# Patient Record
Sex: Male | Born: 1950 | Race: Black or African American | Hispanic: No | Marital: Married | Smoking: Never smoker
Health system: Southern US, Community
[De-identification: ages and names within clinical notes are randomized; demographics above are authoritative.]

## PROBLEM LIST (undated history)

## (undated) DIAGNOSIS — G8929 Other chronic pain: Secondary | ICD-10-CM

## (undated) DIAGNOSIS — I10 Essential (primary) hypertension: Secondary | ICD-10-CM

## (undated) DIAGNOSIS — M549 Dorsalgia, unspecified: Secondary | ICD-10-CM

## (undated) HISTORY — PX: SPINE SURGERY: SHX786

---

## 2002-10-28 ENCOUNTER — Ambulatory Visit (HOSPITAL_COMMUNITY): Admission: RE | Admit: 2002-10-28 | Discharge: 2002-10-28 | Payer: Self-pay | Admitting: Internal Medicine

## 2012-03-10 ENCOUNTER — Emergency Department (HOSPITAL_COMMUNITY): Payer: No Typology Code available for payment source

## 2012-03-10 ENCOUNTER — Emergency Department (HOSPITAL_COMMUNITY)
Admission: EM | Admit: 2012-03-10 | Discharge: 2012-03-11 | Disposition: A | Discharge status: Home / Self Care | Payer: No Typology Code available for payment source | Attending: Emergency Medicine | Admitting: Emergency Medicine

## 2012-03-10 ENCOUNTER — Encounter (HOSPITAL_COMMUNITY): Payer: Self-pay | Admitting: Emergency Medicine

## 2012-03-10 DIAGNOSIS — M549 Dorsalgia, unspecified: Secondary | ICD-10-CM | POA: Insufficient documentation

## 2012-03-10 DIAGNOSIS — S161XXA Strain of muscle, fascia and tendon at neck level, initial encounter: Secondary | ICD-10-CM

## 2012-03-10 DIAGNOSIS — IMO0002 Reserved for concepts with insufficient information to code with codable children: Secondary | ICD-10-CM | POA: Insufficient documentation

## 2012-03-10 DIAGNOSIS — S335XXA Sprain of ligaments of lumbar spine, initial encounter: Secondary | ICD-10-CM | POA: Insufficient documentation

## 2012-03-10 DIAGNOSIS — M542 Cervicalgia: Secondary | ICD-10-CM | POA: Insufficient documentation

## 2012-03-10 DIAGNOSIS — M25519 Pain in unspecified shoulder: Secondary | ICD-10-CM | POA: Insufficient documentation

## 2012-03-10 DIAGNOSIS — S46911A Strain of unspecified muscle, fascia and tendon at shoulder and upper arm level, right arm, initial encounter: Secondary | ICD-10-CM

## 2012-03-10 DIAGNOSIS — S139XXA Sprain of joints and ligaments of unspecified parts of neck, initial encounter: Secondary | ICD-10-CM | POA: Insufficient documentation

## 2012-03-10 DIAGNOSIS — S39012A Strain of muscle, fascia and tendon of lower back, initial encounter: Secondary | ICD-10-CM

## 2012-03-10 DIAGNOSIS — Y92410 Unspecified street and highway as the place of occurrence of the external cause: Secondary | ICD-10-CM | POA: Insufficient documentation

## 2012-03-10 HISTORY — DX: Essential (primary) hypertension: I10

## 2012-03-10 MED ORDER — ACETAMINOPHEN 325 MG PO TABS
650.0000 mg | ORAL_TABLET | Freq: Once | ORAL | Status: AC
  Administered 2012-03-10: 650 mg via ORAL
  Filled 2012-03-10: qty 2

## 2012-03-10 NOTE — ED Notes (Signed)
Patient transported to X-ray 
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
## 2012-03-11 MED ORDER — OXYCODONE-ACETAMINOPHEN 5-325 MG PO TABS: 1.0000 | ORAL_TABLET | ORAL | Status: AC | PRN

## 2012-03-11 MED ORDER — DIAZEPAM 5 MG PO TABS: 5.0000 mg | ORAL_TABLET | Freq: Two times a day (BID) | ORAL | Status: AC

## 2012-03-11 NOTE — ED Provider Notes (Signed)
Medical screening examination/treatment/procedure(s) were performed by non-physician practitioner and as supervising physician I was immediately available for consultation/collaboration.    Vida Roller, MD 03/11/12 1007

## 2012-04-15 ENCOUNTER — Other Ambulatory Visit (HOSPITAL_COMMUNITY): Payer: Self-pay | Admitting: Neurosurgery

## 2012-04-15 DIAGNOSIS — M545 Low back pain, unspecified: Secondary | ICD-10-CM

## 2012-04-30 ENCOUNTER — Ambulatory Visit (HOSPITAL_COMMUNITY)
Admission: RE | Admit: 2012-04-30 | Discharge: 2012-04-30 | Disposition: A | Discharge status: Home / Self Care | Payer: No Typology Code available for payment source | Source: Ambulatory Visit | Attending: Neurosurgery | Admitting: Neurosurgery

## 2012-04-30 DIAGNOSIS — M79609 Pain in unspecified limb: Secondary | ICD-10-CM | POA: Insufficient documentation

## 2012-04-30 DIAGNOSIS — M48061 Spinal stenosis, lumbar region without neurogenic claudication: Secondary | ICD-10-CM | POA: Insufficient documentation

## 2012-04-30 DIAGNOSIS — M545 Low back pain, unspecified: Secondary | ICD-10-CM | POA: Insufficient documentation

## 2012-04-30 DIAGNOSIS — K7689 Other specified diseases of liver: Secondary | ICD-10-CM | POA: Insufficient documentation

## 2012-05-11 ENCOUNTER — Other Ambulatory Visit (HOSPITAL_COMMUNITY): Payer: Self-pay | Admitting: Neurosurgery

## 2013-02-17 ENCOUNTER — Ambulatory Visit (INDEPENDENT_AMBULATORY_CARE_PROVIDER_SITE_OTHER): Payer: Medicare Other | Admitting: Family Medicine

## 2013-02-17 VITALS — BP 139/81 | HR 80 | Temp 98.3°F | Resp 16 | Ht 70.25 in | Wt 179.4 lb

## 2013-02-17 DIAGNOSIS — M545 Low back pain, unspecified: Secondary | ICD-10-CM

## 2013-02-17 DIAGNOSIS — IMO0002 Reserved for concepts with insufficient information to code with codable children: Secondary | ICD-10-CM

## 2013-02-17 DIAGNOSIS — M5410 Radiculopathy, site unspecified: Secondary | ICD-10-CM

## 2013-02-17 MED ORDER — OXYCODONE HCL 10 MG PO TABS: 10.0000 mg | ORAL_TABLET | Freq: Three times a day (TID) | ORAL | Status: DC

## 2013-02-17 NOTE — Progress Notes (Signed)
[facility]:  Office Visit  Chief Complaint:  Chief Complaint  Patient presents with  . Medication Refill  . Back Pain    HPI: Kurt Robinson is a 62 y.o. male who complains of low chronic back pain  Dr. Shane Crutch was his prior doctor for last 3 years but now retired and is no linger prescribing him narcotic meds. He works at [employer] He states that his Last pain meds has been over 30 days He was getting pain meds from Dr. Roseanne Reno from [facility] as well after Shane Crutch retired, he is moving to [place] and patient can't go there He is a retired Naval architect. He hurt his back on the job.  He hurt his back trying to raise th tractor trailer and ended up moving his back and hurting it instead  in 2011.  Has been to [facility], all they do is give him injections which did not help  Was getting only cortisone injections and was not working He was evaluated by Dr. Lelon Perla who is with [facility]  Who then sent  him to Dr. Shane Crutch He is having similar 10?10 sharp pain, denies incontinence, constipation; denies any new weakness,numbness or tingling. He does not think that he is dependent on it, it is just that the ocy 10 is the only thing that works, he sotmeimes has to take more of it dpending on what he activities he does.     04/2012 MRI of L spine shows:  1. 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  2. 2.5 cm right hepatic lobe lesion requires further evaluation.  Recommend follow-up dedicated MR liver without and with contrast.  He states he has been checked by [facility] and  his liver is ok.     Past Medical History  Diagnosis Date  . Hypertension    Past Surgical History  Procedure Laterality Date  . Spine surgery     History   Social History  . Marital Status: Married    Spouse Name: N/A    Number of Children: N/A  . Years of Education: N/A   Social History Main Topics  . Smoking status:  Never Smoker   . Smokeless tobacco: None  . Alcohol Use: No  . Drug Use: No  . Sexually Active: None   Other Topics Concern  . None   Social History Narrative  . None   Family History  Problem Relation Age of Onset  . Diabetes Other   . Stroke Other   . Diabetes Mother    Allergies  Allergen Reactions  . Celecoxib Nausea Only   Prior to Admission medications   Medication Sig Start Date End Date Taking? Authorizing Provider  Oxycodone HCl 10 MG TABS Take 10 mg by mouth 3 (three) times daily.   Yes Historical Provider, MD     ROS: The patient denies fevers, chills, night sweats, unintentional weight loss, chest pain, palpitations, wheezing, dyspnea on exertion, nausea, vomiting, abdominal pain, dysuria, hematuria, melena, numbness, weakness, or tingling.   All other systems have been reviewed and were otherwise negative with the exception of those mentioned in the HPI and as above.    PHYSICAL EXAM: Filed Vitals:   02/17/13 1643  BP: 139/81  Pulse: 80  Temp: 98.3 F (36.8 C)  Resp: 16   Filed Vitals:   02/17/13 1643  Height: 5' 10.25" (1.784 m)  Weight: 179 lb 6.4 oz (81.375 kg)  Body mass index is 25.57 kg/(m^2).  General: Alert, no acute distress HEENT:  Normocephalic, atraumatic, oropharynx patent.  Cardiovascular:  Regular rate and rhythm, no rubs murmurs or gallops.  No Carotid bruits, radial pulse intact. No pedal edema.  Respiratory: Clear to auscultation bilaterally.  No wheezes, rales, or rhonchi.  No cyanosis, no use of accessory musculature GI: No organomegaly, abdomen is soft and non-tender, positive bowel sounds.  No masses. No heaptomegaly Skin: No rashes. Neurologic: Facial musculature symmetric. Psychiatric: Patient is appropriate throughout our interaction. Lymphatic: No cervical lymphadenopathy Musculoskeletal: Gait intact. Tender-left side l-spine, para msk Decrease ROM in extension and flexion due to pain No saddle anesthesia 5/5  strength UE and Tyleigh Mahn 2/2 DTRs knee, ankle bil  LABS: No results found for this or any previous visit.   EKG/XRAY:   Primary read interpreted by Dr. Conley Rolls at [facility].   ASSESSMENT/PLAN: Encounter Diagnosis  Name Primary?  . 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 I can't verify all the pieces but he admits to me after a lengthy discussion that he is in so much pain he was not taking the meds as prescribed, He is supposed to take oxycodone 10 mg TID and had his last rx refilled on 5/16 at the [facility]. He has been out of it for about 13 -14 days.  He states sometime he takes more since he is in so much pain.  I will call Hassan's office and also [facility] management to see if he has ben discharged and update his chart if that is the case.  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  Rx 2 weeks supply of oxycodone 10 mg TID #42 pills. No more refills from our office for chronic pain. WE DISCUSSED HIS LIVER LESION ON PRIOR MRI, HE STATES THAT [facility] AND EVERYTHING WAS NORMAL. I will only rx oxycodone and not oxy-acetaminophen for this reason, he may be an unreliable source Return to [facility] Will refer to neurosurgeon or spine specialist, He was seen by Jerilynn Birkenhead Narcotic profile pulled, he is taking too much meds Gross sideeffects, risk and benefits, and alternatives of medications d/w patient. Patient is aware that all medications have potential sideeffects and we are unable to predict every sideeffect or drug-drug interaction that may occur. He denies incontinence, constipation; denies any new weakness,numbness or tingling.  F/u prn     Lameeka Schleifer PHUONG, DO 02/17/2013 5:54 PM

## 2014-05-08 ENCOUNTER — Encounter (HOSPITAL_COMMUNITY): Payer: Self-pay | Admitting: Emergency Medicine

## 2014-05-08 ENCOUNTER — Emergency Department (HOSPITAL_COMMUNITY): Payer: Medicare Other

## 2014-05-08 ENCOUNTER — Emergency Department (HOSPITAL_COMMUNITY)
Admission: EM | Admit: 2014-05-08 | Discharge: 2014-05-08 | Disposition: A | Discharge status: Home / Self Care | Payer: Medicare Other | Attending: Emergency Medicine | Admitting: Emergency Medicine

## 2014-05-08 DIAGNOSIS — J069 Acute upper respiratory infection, unspecified: Secondary | ICD-10-CM | POA: Insufficient documentation

## 2014-05-08 DIAGNOSIS — J029 Acute pharyngitis, unspecified: Secondary | ICD-10-CM | POA: Insufficient documentation

## 2014-05-08 DIAGNOSIS — IMO0002 Reserved for concepts with insufficient information to code with codable children: Secondary | ICD-10-CM | POA: Insufficient documentation

## 2014-05-08 DIAGNOSIS — I10 Essential (primary) hypertension: Secondary | ICD-10-CM | POA: Insufficient documentation

## 2014-05-08 DIAGNOSIS — G8929 Other chronic pain: Secondary | ICD-10-CM | POA: Insufficient documentation

## 2014-05-08 HISTORY — DX: Other chronic pain: G89.29

## 2014-05-08 HISTORY — DX: Dorsalgia, unspecified: M54.9

## 2014-05-08 MED ORDER — PREDNISONE 20 MG PO TABS
60.0000 mg | ORAL_TABLET | Freq: Once | ORAL | Status: AC
  Administered 2014-05-08: 60 mg via ORAL
  Filled 2014-05-08: qty 3

## 2014-05-08 MED ORDER — BENZONATATE 100 MG PO CAPS: 100.0000 mg | ORAL_CAPSULE | Freq: Three times a day (TID) | ORAL | Status: DC | PRN

## 2014-05-08 MED ORDER — SALINE SPRAY 0.65 % NA SOLN: 1.0000 | NASAL | Status: DC | PRN

## 2014-05-08 MED ORDER — PREDNISONE 20 MG PO TABS: 60.0000 mg | ORAL_TABLET | Freq: Every day | ORAL | Status: DC

## 2014-05-08 MED ORDER — BENZONATATE 100 MG PO CAPS
100.0000 mg | ORAL_CAPSULE | Freq: Once | ORAL | Status: AC
  Administered 2014-05-08: 100 mg via ORAL
  Filled 2014-05-08: qty 1

## 2014-05-08 NOTE — Discharge Instructions (Signed)
Upper Respiratory Infection, Adult An upper respiratory infection (URI) is also sometimes known as the common cold. The upper respiratory tract includes the nose, sinuses, throat, trachea, and bronchi. Bronchi are the airways leading to the lungs. Most people improve within 1 week, but symptoms can last up to 2 weeks. A residual cough may last even longer.  CAUSES Many different viruses can infect the tissues lining the upper respiratory tract. The tissues become irritated and inflamed and often become very moist. Mucus production is also common. A cold is contagious. You can easily spread the virus to others by oral contact. This includes kissing, sharing a glass, coughing, or sneezing. Touching your mouth or nose and then touching a surface, which is then touched by another person, can also spread the virus. SYMPTOMS  Symptoms typically develop 1 to 3 days after you come in contact with a cold virus. Symptoms vary from person to person. They may include:  Runny nose.  Sneezing.  Nasal congestion.  Sinus irritation.  Sore throat.  Loss of voice (laryngitis).  Cough.  Fatigue.  Muscle aches.  Loss of appetite.  Headache.  Low-grade fever. DIAGNOSIS  You might diagnose your own cold based on familiar symptoms, since most people get a cold 2 to 3 times a year. Your caregiver can confirm this based on your exam. Most importantly, your caregiver can check that your symptoms are not due to another disease such as strep throat, sinusitis, pneumonia, asthma, or epiglottitis. Blood tests, throat tests, and X-rays are not necessary to diagnose a common cold, but they may sometimes be helpful in excluding other more serious diseases. Your caregiver will decide if any further tests are required. RISKS AND COMPLICATIONS  You may be at risk for a more severe case of the common cold if you smoke cigarettes, have chronic heart disease (such as heart failure) or lung disease (such as asthma), or if  you have a weakened immune system. The very young and very old are also at risk for more serious infections. Bacterial sinusitis, middle ear infections, and bacterial pneumonia can complicate the common cold. The common cold can worsen asthma and chronic obstructive pulmonary disease (COPD). Sometimes, these complications can require emergency medical care and may be life-threatening. PREVENTION  The best way to protect against getting a cold is to practice good hygiene. Avoid oral or hand contact with people with cold symptoms. Wash your hands often if contact occurs. There is no clear evidence that vitamin C, vitamin E, echinacea, or exercise reduces the chance of developing a cold. However, it is always recommended to get plenty of rest and practice good nutrition. TREATMENT  Treatment is directed at relieving symptoms. There is no cure. Antibiotics are not effective, because the infection is caused by a virus, not by bacteria. Treatment may include:  Increased fluid intake. Sports drinks offer valuable electrolytes, sugars, and fluids.  Breathing heated mist or steam (vaporizer or shower).  Eating chicken soup or other clear broths, and maintaining good nutrition.  Getting plenty of rest.  Using gargles or lozenges for comfort.  Controlling fevers with ibuprofen or acetaminophen as directed by your caregiver.  Increasing usage of your inhaler if you have asthma. Zinc gel and zinc lozenges, taken in the first 24 hours of the common cold, can shorten the duration and lessen the severity of symptoms. Pain medicines may help with fever, muscle aches, and throat pain. A variety of non-prescription medicines are available to treat congestion and runny nose. Your caregiver   can make recommendations and may suggest nasal or lung inhalers for other symptoms.  HOME CARE INSTRUCTIONS   Only take over-the-counter or prescription medicines for pain, discomfort, or fever as directed by your  caregiver.  Use a warm mist humidifier or inhale steam from a shower to increase air moisture. This may keep secretions moist and make it easier to breathe.  Drink enough water and fluids to keep your urine clear or pale yellow.  Rest as needed.  Return to work when your temperature has returned to normal or as your caregiver advises. You may need to stay home longer to avoid infecting others. You can also use a face mask and careful hand washing to prevent spread of the virus. SEEK MEDICAL CARE IF:   After the first few days, you feel you are getting worse rather than better.  You need your caregiver's advice about medicines to control symptoms.  You develop chills, worsening shortness of breath, or brown or red sputum. These may be signs of pneumonia.  You develop yellow or brown nasal discharge or pain in the face, especially when you bend forward. These may be signs of sinusitis.  You develop a fever, swollen neck glands, pain with swallowing, or white areas in the back of your throat. These may be signs of strep throat. SEEK IMMEDIATE MEDICAL CARE IF:   You have a fever.  You develop severe or persistent headache, ear pain, sinus pain, or chest pain.  You develop wheezing, a prolonged cough, cough up blood, or have a change in your usual mucus (if you have chronic lung disease).  You develop sore muscles or a stiff neck. Document Released: 02/18/2001 Document Revised: 11/17/2011 Document Reviewed: 11/30/2013 Exit[employer] Patient Information 2015 [employer]. This information is not intended to replace advice given to you by your health care provider. Make sure you discuss any questions you have with your health care provider.  

## 2014-05-08 NOTE — ED Provider Notes (Signed)
CSN: [id number]     Arrival date & time 05/08/14  0110 History   First MD Initiated Contact with Patient 05/08/14 0117     Chief Complaint  Patient presents with  . Generalized Body Aches     (Consider location/radiation/quality/duration/timing/severity/associated sxs/prior Treatment) HPI  This is a 63 year old male with a history of hypertension and chronic back pain who presents with generalized bodyaches, sore throat, cough. Onset of symptoms were Monday. Patient reports a cough productive of yellow phlegm. He endorses chills without objective fevers. He states that he gets chest pain with coughing. He is a nonsmoker. He has tried air the counter cough syrup which is not helped. Patient also reports sore throat. He denies any difficulty swallowing or breathing. He was seen by his primary care physician on Friday and given cough syrup which is "not helping." Patient states that he was told that he had a cold. He states when he normally gets these symptoms, amoxicillin "takes care of it." Patient denies any nausea, vomiting, diarrhea, abdominal pain. No known sick contacts.  Past Medical History  Diagnosis Date  . Hypertension   . Chronic back pain    Past Surgical History  Procedure Laterality Date  . Spine surgery     Family History  Problem Relation Age of Onset  . Diabetes Other   . Stroke Other   . Diabetes Mother    History  Substance Use Topics  . Smoking status: Never Smoker   . Smokeless tobacco: Not on file  . Alcohol Use: No    Review of Systems  Constitutional: Positive for chills. Negative for fever.  HENT: Positive for rhinorrhea and sore throat. Negative for ear pain and sinus pressure.   Respiratory: Positive for cough and shortness of breath. Negative for chest tightness.   Cardiovascular: Negative.  Negative for chest pain.  Gastrointestinal: Negative.  Negative for nausea, vomiting, abdominal pain and diarrhea.  Genitourinary: Negative.  Negative for  dysuria.  Skin: Negative for rash.  Neurological: Negative for headaches.  All other systems reviewed and are negative.     Allergies  Celecoxib  Home Medications   Prior to Admission medications   Medication Sig Start Date End Date Taking? Authorizing Provider  fluticasone (FLONASE) 50 MCG/ACT nasal spray Place 1 spray into both nostrils daily.   Yes Historical Provider, MD  benzonatate (TESSALON) 100 MG capsule Take 1 capsule (100 mg total) by mouth 3 (three) times daily as needed for cough. 05/08/14   Shon Baton, MD  predniSONE (DELTASONE) 20 MG tablet Take 3 tablets (60 mg total) by mouth daily with breakfast. 05/08/14   Shon Baton, MD  sodium chloride (OCEAN) 0.65 % SOLN nasal spray Place 1 spray into both nostrils as needed for congestion. 05/08/14   Shon Baton, MD   BP 137/95  Pulse 78  Temp(Src) 99.1 F (37.3 C) (Oral)  Resp 14  Ht  (1.753 m)  Wt 174 lb (78.926 kg)  BMI 25.68 kg/m2  SpO2 100% Physical Exam  Nursing note and vitals reviewed. Constitutional: He is oriented to person, place, and time. He appears well-developed and well-nourished.  HENT:  Head: Normocephalic and atraumatic.  Mouth/Throat: Oropharynx is clear and moist. No oropharyngeal exudate.  Posterior oropharynx without significant erythema, postnasal drip noted  Eyes: Pupils are equal, round, and reactive to light.  Neck: Neck supple.  Cardiovascular: Normal rate, regular rhythm and normal heart sounds.   No murmur heard. Pulmonary/Chest: Effort normal and breath sounds normal.  No respiratory distress. He has no wheezes. He exhibits tenderness.  Abdominal: Soft. Bowel sounds are normal. There is no tenderness. There is no rebound.  Musculoskeletal: He exhibits no edema.  Lymphadenopathy:    He has no cervical adenopathy.  Neurological: He is alert and oriented to person, place, and time.  Skin: Skin is warm and dry. No rash noted.  Psychiatric: He has a normal mood and  affect.    ED Course  Procedures (including critical care time) Labs Review Labs Reviewed - No data to display  Imaging Review Dg Chest 2 View  05/08/2014   CLINICAL DATA:  GENERALIZED BODY ACHES  EXAM: CHEST - 2 VIEW  COMPARISON:  03/10/2012  FINDINGS: Lungs are clear. Heart size and mediastinal contours are within normal limits. No effusion. Visualized skeletal structures are unremarkable.  IMPRESSION: No acute cardiopulmonary disease.   Electronically Signed   By: Oley Balm M.D.   On: 05/08/2014 01:59     EKG Interpretation None      MDM   Final diagnoses:  Upper respiratory infection    Patient presents with URI symptoms including chills, sore throat, cough, shortness of breath. He is nontoxic on exam. He is afebrile. His pulmonary exam is reassuring. He does have tenderness to palpation that is reproducible on exam. Suspect patient's chest pain with coughing it is likely musculoskeletal. Will obtain a chest x-ray to rule out pneumonia. Patient was given Tessalon Perles and prednisone. Chest x-ray is negative for pneumonia. Discuss with patient optimizing supportive care including nasal saline, burst of steroids, andTessalon Perles. Symptoms are most consistent with a viral process. At this time I do not see any indication for antibiotics.  Patient will followup with his primary physician symptoms worsen. He was advised that cough may persist up to 2 weeks from onset of symptoms. Patient stated understanding and was in agreement with plan.  After history, exam, and medical workup I feel the patient has been appropriately medically screened and is safe for discharge home. Pertinent diagnoses were discussed with the patient. Patient was given return precautions.     Shon Baton, MD 05/08/14 [phone]

## 2014-05-08 NOTE — ED Notes (Signed)
Pt arrives c/o of generalized body aches, malaise, sore throat, cough, dizziness since Tuesday. Coughing up phlegm.

## 2015-06-29 IMAGING — CR DG CHEST 2V
2 series · 2 of 2 positions shown · non-contrast
Comparison: 03/10/2012

CLINICAL DATA: GENERALIZED BODY ACHES

EXAM:
CHEST - 2 VIEW

[w chest pa]
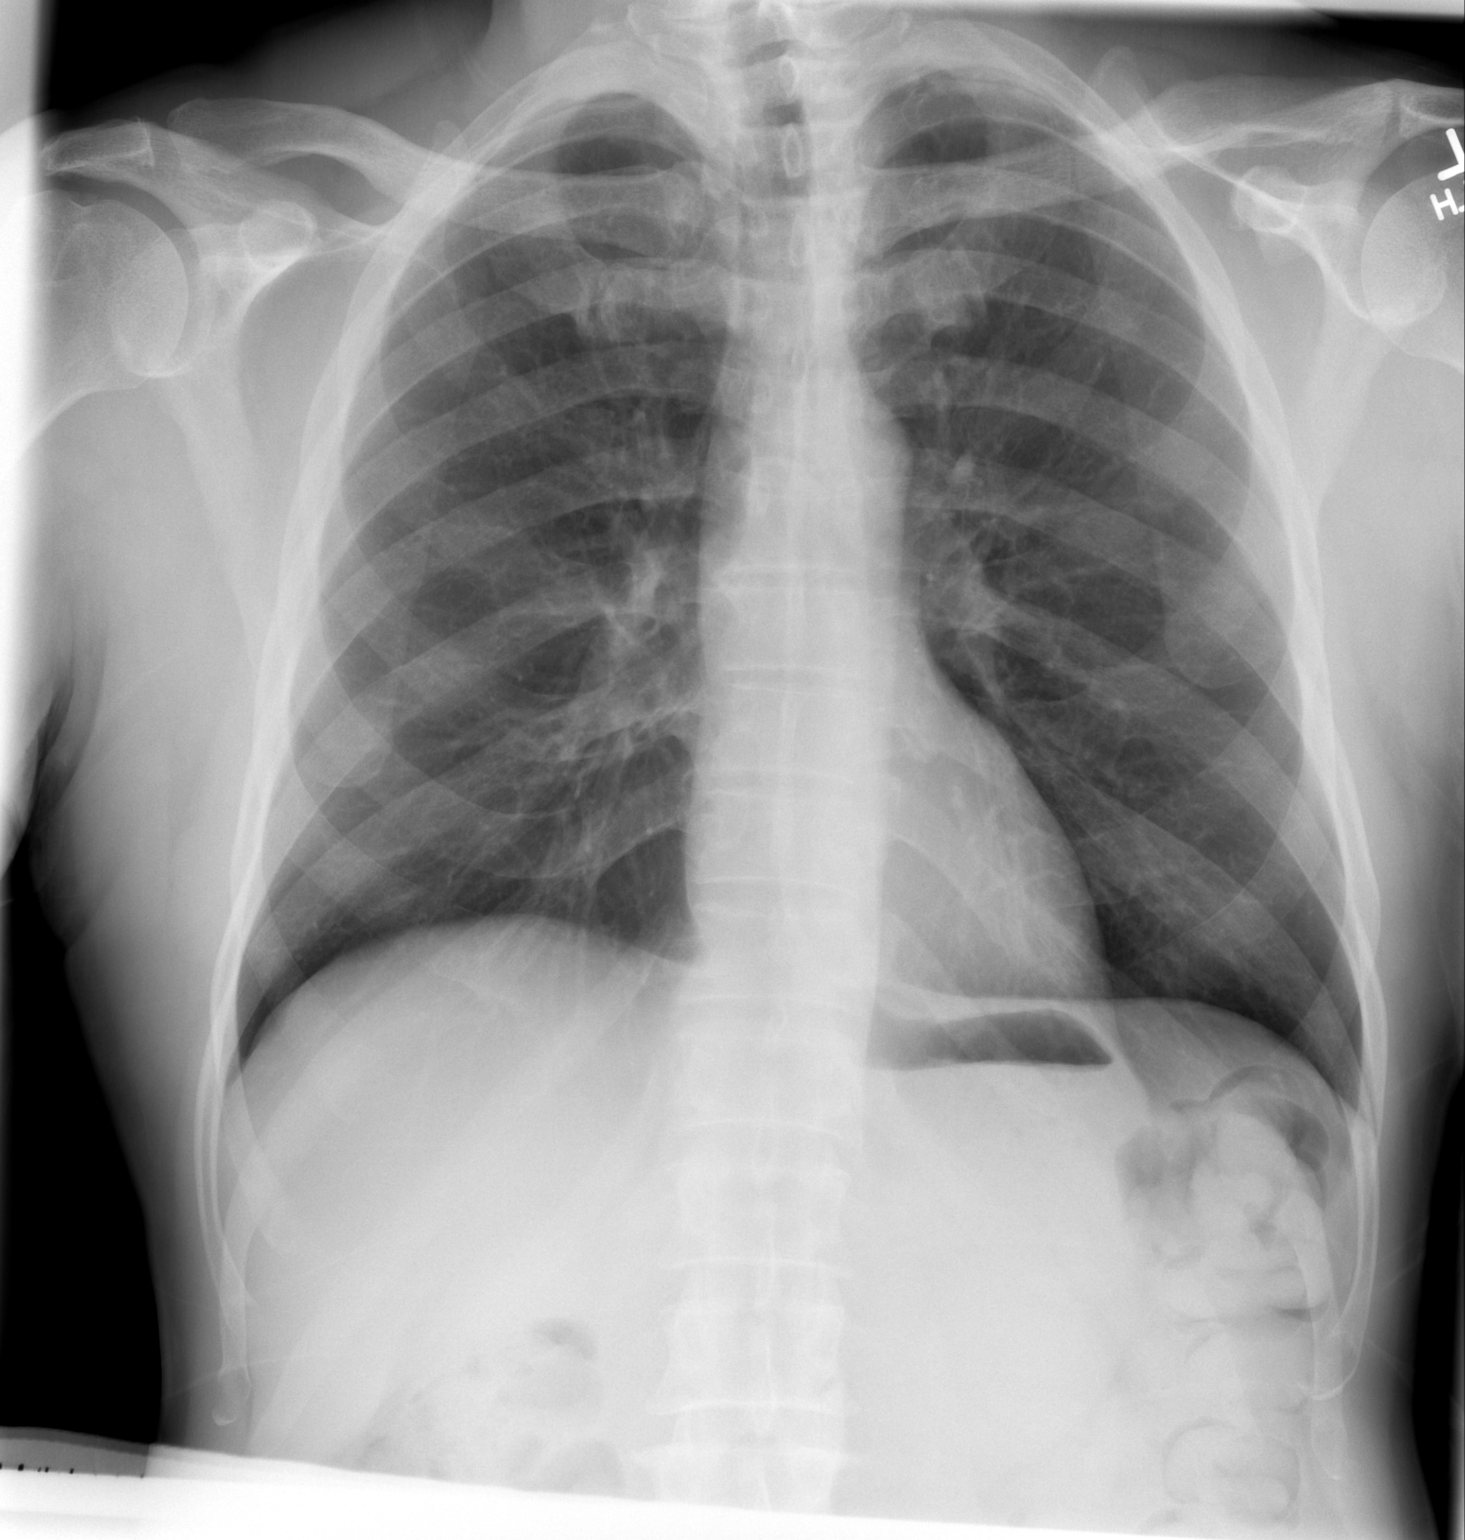

[w chest lat]
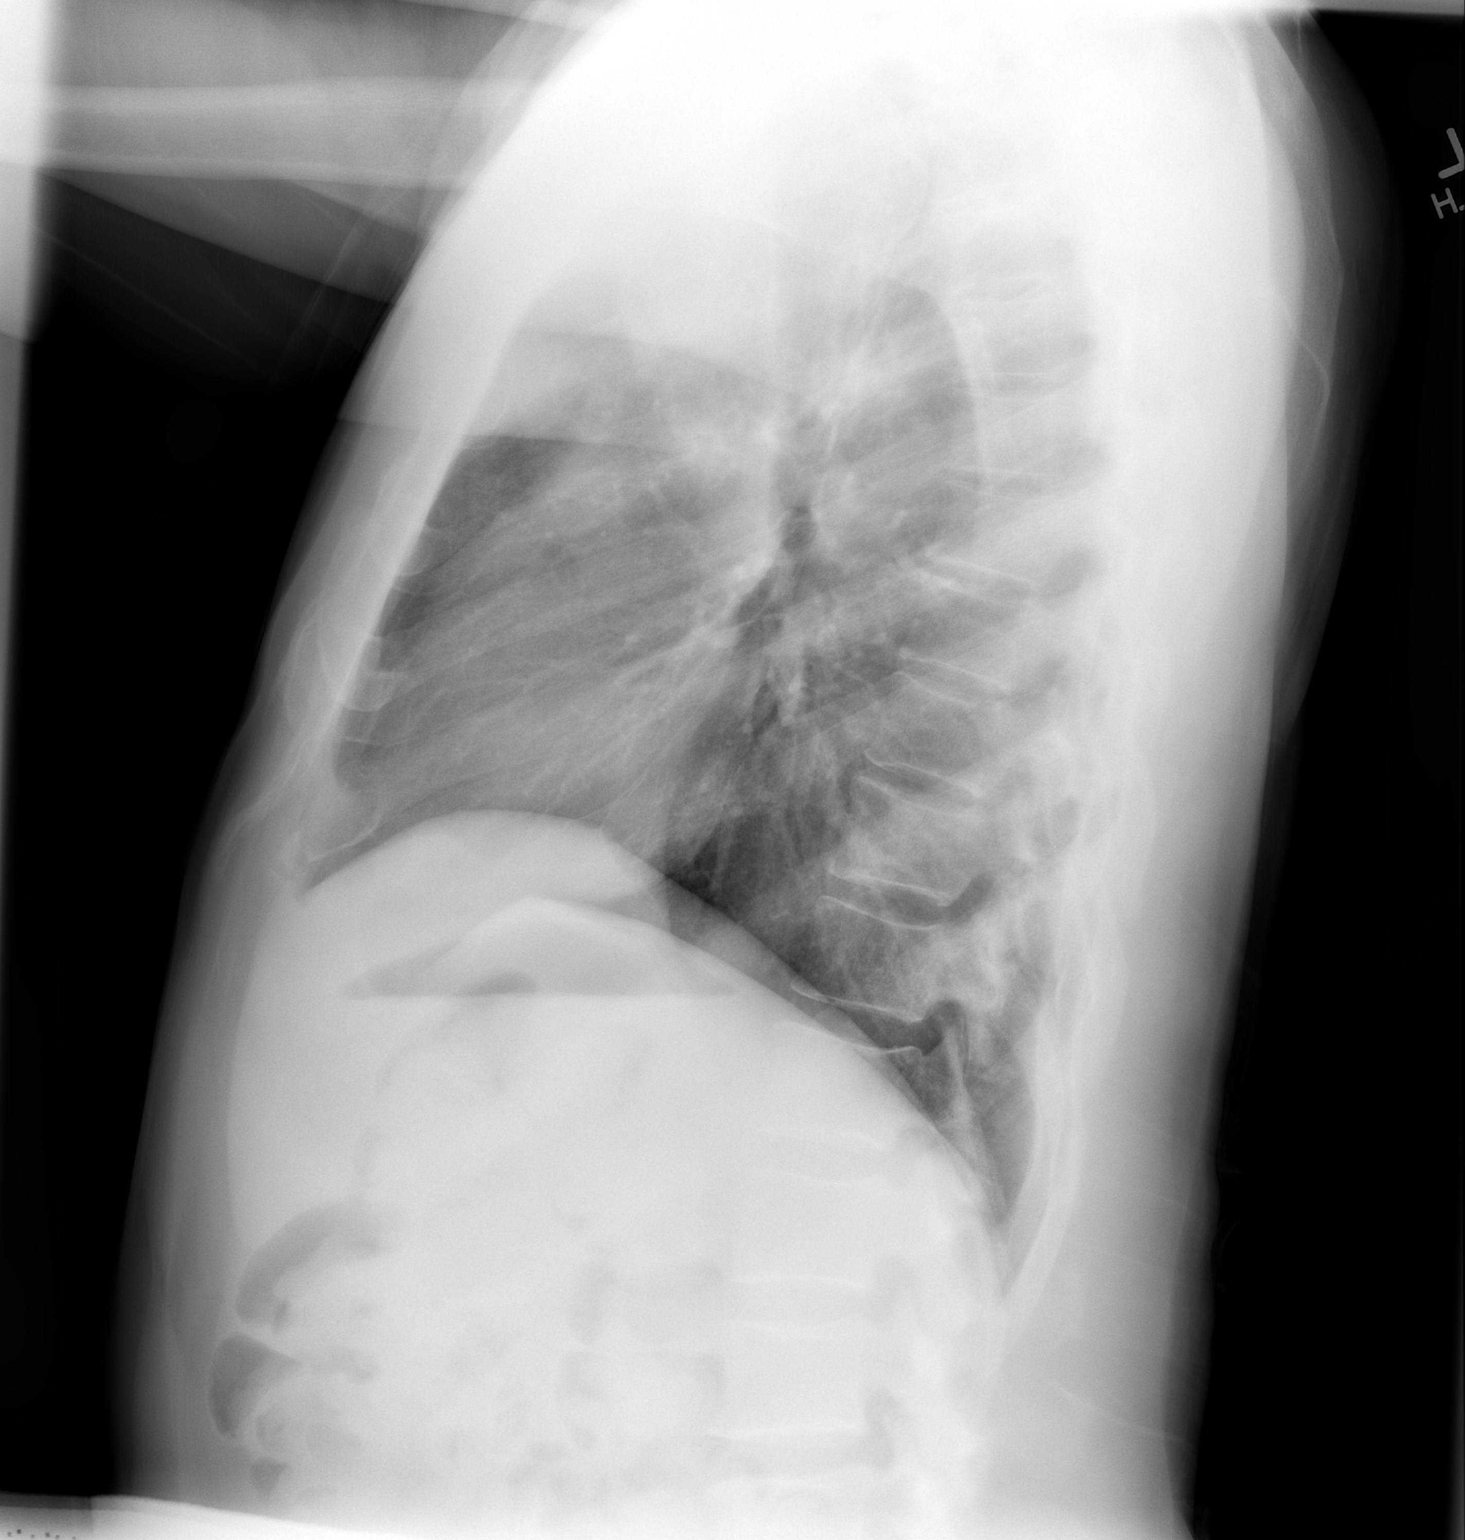

[2 of 2 positions shown; findings below may reference images not displayed]

FINDINGS: Lungs are clear. Heart size and mediastinal contours are within
normal limits.
No effusion.
Visualized skeletal structures are unremarkable.
IMPRESSION: No acute cardiopulmonary disease.

## 2019-04-17 ENCOUNTER — Emergency Department (HOSPITAL_COMMUNITY): Payer: Medicare Other

## 2019-04-17 ENCOUNTER — Other Ambulatory Visit: Payer: Self-pay

## 2019-04-17 ENCOUNTER — Encounter (HOSPITAL_COMMUNITY): Payer: Self-pay | Admitting: *Deleted

## 2019-04-17 ENCOUNTER — Emergency Department (HOSPITAL_COMMUNITY)
Admission: EM | Admit: 2019-04-17 | Discharge: 2019-04-17 | Disposition: A | Discharge status: Home / Self Care | Payer: Medicare Other | Attending: Emergency Medicine | Admitting: Emergency Medicine

## 2019-04-17 DIAGNOSIS — Z79899 Other long term (current) drug therapy: Secondary | ICD-10-CM | POA: Diagnosis not present

## 2019-04-17 DIAGNOSIS — R55 Syncope and collapse: Secondary | ICD-10-CM

## 2019-04-17 DIAGNOSIS — F1110 Opioid abuse, uncomplicated: Secondary | ICD-10-CM | POA: Diagnosis not present

## 2019-04-17 DIAGNOSIS — T50901A Poisoning by unspecified drugs, medicaments and biological substances, accidental (unintentional), initial encounter: Secondary | ICD-10-CM | POA: Diagnosis not present

## 2019-04-17 DIAGNOSIS — I10 Essential (primary) hypertension: Secondary | ICD-10-CM | POA: Insufficient documentation

## 2019-04-17 LAB — COMPREHENSIVE METABOLIC PANEL
ALT: 20 U/L (ref 0–44)
AST: 23 U/L (ref 15–41)
Albumin: 3.8 g/dL (ref 3.5–5.0)
Alkaline Phosphatase: 55 U/L (ref 38–126)
Anion gap: 12 (ref 5–15)
BUN: 13 mg/dL (ref 8–23)
CO2: 23 mmol/L (ref 22–32)
Calcium: 8.8 mg/dL — ABNORMAL LOW (ref 8.9–10.3)
Chloride: 105 mmol/L (ref 98–111)
Creatinine, Ser: 1.29 mg/dL — ABNORMAL HIGH (ref 0.61–1.24)
GFR calc Af Amer: >60 mL/min (ref >60)
GFR calc non Af Amer: 57 mL/min — ABNORMAL LOW (ref >60)
Glucose, Bld: 148 mg/dL — ABNORMAL HIGH (ref 70–99)
Potassium: 3.2 mmol/L — ABNORMAL LOW (ref 3.5–5.1)
Sodium: 140 mmol/L (ref 135–145)
Total Bilirubin: 0.7 mg/dL (ref 0.3–1.2)
Total Protein: 6.8 g/dL (ref 6.5–8.1)

## 2019-04-17 LAB — ETHANOL: Alcohol, Ethyl (B): <10 mg/dL (ref <10)

## 2019-04-17 LAB — CBC WITH DIFFERENTIAL/PLATELET
Abs Immature Granulocytes: 0.04 10*3/uL (ref 0.00–0.07)
Basophils Absolute: 0 10*3/uL (ref 0.0–0.1)
Basophils Relative: 1 %
Eosinophils Absolute: 0.2 10*3/uL (ref 0.0–0.5)
Eosinophils Relative: 3 %
HCT: 38.1 % — ABNORMAL LOW (ref 39.0–52.0)
Hemoglobin: 12.6 g/dL — ABNORMAL LOW (ref 13.0–17.0)
Immature Granulocytes: 1 %
Lymphocytes Relative: 22 %
Lymphs Abs: 1.3 10*3/uL (ref 0.7–4.0)
MCH: 28.9 pg (ref 26.0–34.0)
MCHC: 33.1 g/dL (ref 30.0–36.0)
MCV: 87.4 fL (ref 80.0–100.0)
Monocytes Absolute: 0.4 10*3/uL (ref 0.1–1.0)
Monocytes Relative: 6 %
Neutro Abs: 3.8 10*3/uL (ref 1.7–7.7)
Neutrophils Relative %: 67 %
Platelets: 298 10*3/uL (ref 150–400)
RBC: 4.36 MIL/uL (ref 4.22–5.81)
RDW: 14.5 % (ref 11.5–15.5)
WBC: 5.6 10*3/uL (ref 4.0–10.5)
nRBC: 0 % (ref 0.0–0.2)

## 2019-04-17 LAB — URINALYSIS, ROUTINE W REFLEX MICROSCOPIC
Bilirubin Urine: NEGATIVE
Glucose, UA: NEGATIVE mg/dL
Hgb urine dipstick: NEGATIVE
Ketones, ur: NEGATIVE mg/dL
Leukocytes,Ua: NEGATIVE
Nitrite: NEGATIVE
Protein, ur: NEGATIVE mg/dL
Specific Gravity, Urine: 1.018 (ref 1.005–1.030)
pH: 5 (ref 5.0–8.0)

## 2019-04-17 LAB — RAPID URINE DRUG SCREEN, HOSP PERFORMED
Amphetamines: NOT DETECTED
Barbiturates: NOT DETECTED
Benzodiazepines: NOT DETECTED
Cocaine: NOT DETECTED
Opiates: POSITIVE — AB
Tetrahydrocannabinol: NOT DETECTED

## 2019-04-17 LAB — CBG MONITORING, ED: Glucose-Capillary: 94 mg/dL (ref 70–99)

## 2019-04-17 MED ORDER — SODIUM CHLORIDE 0.9 % IV BOLUS
500.0000 mL | Freq: Once | INTRAVENOUS | Status: AC
  Administered 2019-04-17: 500 mL via INTRAVENOUS

## 2019-04-17 MED ORDER — POTASSIUM CHLORIDE CRYS ER 20 MEQ PO TBCR
40.0000 meq | EXTENDED_RELEASE_TABLET | Freq: Once | ORAL | Status: AC
  Administered 2019-04-17: 40 meq via ORAL
  Filled 2019-04-17: qty 2

## 2019-04-17 NOTE — ED Notes (Signed)
Patient is resting comfortably. 

## 2019-04-17 NOTE — ED Notes (Signed)
Patient aware that we need urine sample for testing, unable at this time. Pt given instruction on providing urine sample when able to do so.   

## 2019-04-17 NOTE — ED Provider Notes (Signed)
[facility] Provider Note   CSN: [id number] Arrival date & time: 04/17/19  1909    History   Chief Complaint Chief Complaint  Patient presents with  . Loss of Consciousness    HPI Avie ArenasMarvin D Koopman is a 68 y.o. male.     The history is provided by the patient, medical records and the EMS personnel. No language interpreter was used.  Loss of Consciousness  Avie ArenasMarvin D Daughtrey is a 68 y.o. male who presents to the Emergency Department complaining of syncope. He presents to the emergency department for evaluation following a syncopal event. Level V caveat due to confusion. Per EMS report he was outside with his friend when he looked up to the sky and fell back and was unresponsive. His friends lowered him to the ground. On EMS arrival he was found to have respirations of about two per minute with pinpoint pupils and hypoxia. Respirations were assisted with bag valve mask ventilation until Narcan was administered. EMS report that his right hand was clenched in his left hand was flaccid when they arrived. Following Narcan administration he rapidly responded. He has a history of hypertension and chronic back pain. He takes Oxy 30, three times daily. His last dose was this morning. He denies any recent illnesses. He states that he does not overtake his medications. No prior similar symptoms. He complains of mild headache and nausea on ED arrival. He does not recall what occurred earlier today. Past Medical History:  Diagnosis Date  . Chronic back pain   . Hypertension     There are no active problems to display for this patient.   Past Surgical History:  Procedure Laterality Date  . SPINE SURGERY          Home Medications    Prior to Admission medications   Medication Sig Start Date End Date Taking? Authorizing Provider  acetaminophen (TYLENOL) 500 MG tablet Take 1,000 mg by mouth every 6 (six) hours as needed for moderate pain or headache (lower  back pain).   Yes [provider]  fluticasone (FLONASE) 50 MCG/ACT nasal spray Place 1 spray into both nostrils daily.   Yes [provider]  gabapentin (NEURONTIN) 300 MG capsule Take 300 mg by mouth 3 (three) times daily.   Yes [provider]  hydrochlorothiazide (HYDRODIURIL) 25 MG tablet Take 25 mg by mouth daily.   Yes [provider]  oxycodone (ROXICODONE) 30 MG immediate release tablet Take 30 mg by mouth every 3 (three) hours as needed for pain.   Yes [provider]  benzonatate (TESSALON) 100 MG capsule Take 1 capsule (100 mg total) by mouth 3 (three) times daily as needed for cough. Patient not taking: Reported on 04/17/2019 05/08/14   Horton, Mayer Maskerourtney F, MD  predniSONE (DELTASONE) 20 MG tablet Take 3 tablets (60 mg total) by mouth daily with breakfast. Patient not taking: Reported on 04/17/2019 05/08/14   Horton, Mayer Maskerourtney F, MD  sodium chloride (OCEAN) 0.65 % SOLN nasal spray Place 1 spray into both nostrils as needed for congestion. Patient not taking: Reported on 04/17/2019 05/08/14   Horton, Mayer Maskerourtney F, MD    Family History Family History  Problem Relation Age of Onset  . Diabetes Mother   . Diabetes Other   . Stroke Other     Social History Social History   Tobacco Use  . Smoking status: Never Smoker  Substance Use Topics  . Alcohol use: No  . Drug use: No  Allergies   Celecoxib   Review of Systems Review of Systems  Cardiovascular: Positive for syncope.  All other systems reviewed and are negative.    Physical Exam Updated Vital Signs BP (!) 148/97   Pulse 92   Temp 99.5 F (37.5 C) (Oral)   Resp 14   SpO2 93%   Physical Exam Vitals signs and nursing note reviewed.  Constitutional:      Appearance: He is well-developed.  HENT:     Head: Normocephalic and atraumatic.  Cardiovascular:     Rate and Rhythm: Regular rhythm. Tachycardia present.     Heart sounds: No murmur.  Pulmonary:     Effort:  Pulmonary effort is normal. No respiratory distress.     Breath sounds: Normal breath sounds.  Abdominal:     Palpations: Abdomen is soft.     Tenderness: There is no abdominal tenderness. There is no guarding or rebound.  Musculoskeletal:        General: No tenderness.  Skin:    General: Skin is warm and dry.  Neurological:     Mental Status: He is alert and oriented to person, place, and time.     Comments: Mildly confused. Five out of five strength in all four extremities with sensation to light touch intact in all four extremities  Psychiatric:        Behavior: Behavior normal.      ED Treatments / Results  Labs (all labs ordered are listed, but only abnormal results are displayed) Labs Reviewed  COMPREHENSIVE METABOLIC PANEL - Abnormal; Notable for the following components:      Result Value   Potassium 3.2 (*)    Glucose, Bld 148 (*)    Creatinine, Ser 1.29 (*)    Calcium 8.8 (*)    GFR calc non Af Amer 57 (*)    All other components within normal limits  CBC WITH DIFFERENTIAL/PLATELET - Abnormal; Notable for the following components:   Hemoglobin 12.6 (*)    HCT 38.1 (*)    All other components within normal limits  ETHANOL  URINALYSIS, ROUTINE W REFLEX MICROSCOPIC  RAPID URINE DRUG SCREEN, HOSP PERFORMED  CBG MONITORING, ED    EKG EKG Interpretation  Date/Time:  Sunday April 17 2019 19:32:03 EDT Ventricular Rate:  107 PR Interval:    QRS Duration: 84 QT Interval:  343 QTC Calculation: 458 R Axis:   61 Text Interpretation:  Sinus tachycardia Confirmed by Quintella Reichert [id number]) on 04/17/2019 8:50:22 PM   Radiology Ct Head Wo Contrast  Result Date: 04/17/2019 CLINICAL DATA:  68 year old male with syncope. EXAM: CT HEAD WITHOUT CONTRAST TECHNIQUE: Contiguous axial images were obtained from the base of the skull through the vertex without intravenous contrast. COMPARISON:  None. FINDINGS: Brain: There is mild age-related atrophy and chronic microvascular  ischemic changes. There is no acute intracranial hemorrhage. Linear high attenuating density along the inferior aspect of the left frontal lobe (series 3, image 10) most likely volume averaging artifact with the base of the skull. There is no mass effect or midline shift. No extra-axial fluid collection. Vascular: No hyperdense vessel or unexpected calcification. Skull: Normal. Negative for fracture or focal lesion. Sinuses/Orbits: Mild mucoperiosteal thickening of paranasal sinuses. No air-fluid level. The mastoid air cells are clear. Other: None IMPRESSION: No acute intracranial pathology. Electronically Signed   By: Anner Crete M.D.   On: 04/17/2019 20:32   Dg Chest Port 1 View  Result Date: 04/17/2019 CLINICAL DATA:  Syncope EXAM: PORTABLE CHEST 1  VIEW COMPARISON:  05/08/2014 FINDINGS: Mild right hemidiaphragm elevation. Numerous leads and wires project over the chest. Midline trachea. Mild convex left thoracic spine curvature. No pleural effusion or pneumothorax. Clear lungs. IMPRESSION: No acute cardiopulmonary disease. Electronically Signed   By: Jeronimo GreavesKyle  Talbot M.D.   On: 04/17/2019 19:59    Procedures Procedures (including critical care time)  Medications Ordered in ED Medications  potassium chloride SA (K-DUR) CR tablet 40 mEq (has no administration in time range)  sodium chloride 0.9 % bolus 500 mL (0 mLs Intravenous Stopped 04/17/19 2010)     Initial Impression / Assessment and Plan / ED Course  I have reviewed the triage vital signs and the nursing notes.  Pertinent labs & imaging results that were available during my care of the patient were reviewed by me and considered in my medical decision making (see chart for details).        Patient here for evaluation following syncopal event that responded to Narcan administration. On EMS arrival he had pinpoint pupils, apnea. On ED arrival he is awake and alert and appropriate following commands without difficulty. No reports of  intentional or accidental overdose. He is on chronic oxycodone 30 mg TID. Labs with mild elevation is creatinine, hypokalemia. No priors are available for comparison. Presentation is not consistent with subarachnoid hemorrhage, PE, ACS, dissection, cardiogenic syncope. Discussed with patient concerns for accidental opioid overdose. This may be secondary to accidental ingestion of an additional dose or dehydration. Recommend that he does not take his oxycodone today. Discussed that he needs to follow closely with his PCP. Discussed that if he does restart his oxycodone tomorrow he must start at a lower dose outpatient follow-up and return precautions discussed.  Final Clinical Impressions(s) / ED Diagnoses   Final diagnoses:  Syncope and collapse  Accidental overdose, initial encounter    ED Discharge Orders    None       Tilden Fossaees, [place], MD 04/17/19 2258

## 2019-04-17 NOTE — ED Triage Notes (Signed)
Pt says he does not remember what happened. He is alert and oriented currently. Pt does c/o "feeling tingly" all over and has a headache. Does report having OxyContin prescription that he takes daily, last took this morning. Speech is clear, mae x 4.

## 2019-04-17 NOTE — ED Notes (Signed)
Pt returned from ct. Alert/oriented. Pt says his headache has resolved. Report given to [place], [place]

## 2019-04-17 NOTE — ED Notes (Signed)
Family at bedside. 

## 2019-04-17 NOTE — ED Triage Notes (Signed)
Pt arrives via EMS, pt was walking, stopped started staring at the sky, then syncopized friend was able to catch him. On arrival to scene, pt was unresponsive, right hand was clenched, left hand flaccid. Pt was breathing 2-3 resp/min, saturations 50%RA. Pt was given narcan, almost immediately, pt started responding and breathing effort improved. Pt does take   Oxycotin, denied any further drug use. IV established.

## 2019-04-17 NOTE — Discharge Instructions (Addendum)
Do not take your oxycodone tonight. Drink plenty of fluids. Please follow-up with your family doctor for recheck of your kidney function and potassium.

## 2020-05-12 ENCOUNTER — Other Ambulatory Visit: Payer: Self-pay

## 2020-05-12 ENCOUNTER — Emergency Department (HOSPITAL_COMMUNITY)
Admission: EM | Admit: 2020-05-12 | Discharge: 2020-05-12 | Disposition: A | Discharge status: Home / Self Care | Payer: Medicare Other | Attending: Emergency Medicine | Admitting: Emergency Medicine

## 2020-05-12 DIAGNOSIS — T402X1A Poisoning by other opioids, accidental (unintentional), initial encounter: Secondary | ICD-10-CM | POA: Insufficient documentation

## 2020-05-12 DIAGNOSIS — T50901A Poisoning by unspecified drugs, medicaments and biological substances, accidental (unintentional), initial encounter: Secondary | ICD-10-CM

## 2020-05-12 DIAGNOSIS — Z79899 Other long term (current) drug therapy: Secondary | ICD-10-CM | POA: Insufficient documentation

## 2020-05-12 DIAGNOSIS — I10 Essential (primary) hypertension: Secondary | ICD-10-CM | POA: Insufficient documentation

## 2020-05-12 LAB — CBC WITH DIFFERENTIAL/PLATELET
Abs Immature Granulocytes: 0.02 10*3/uL (ref 0.00–0.07)
Basophils Absolute: 0 10*3/uL (ref 0.0–0.1)
Basophils Relative: 0 %
Eosinophils Absolute: 0.3 10*3/uL (ref 0.0–0.5)
Eosinophils Relative: 5 %
HCT: 36.9 % — ABNORMAL LOW (ref 39.0–52.0)
Hemoglobin: 12.1 g/dL — ABNORMAL LOW (ref 13.0–17.0)
Immature Granulocytes: 0 %
Lymphocytes Relative: 17 %
Lymphs Abs: 1.1 10*3/uL (ref 0.7–4.0)
MCH: 29 pg (ref 26.0–34.0)
MCHC: 32.8 g/dL (ref 30.0–36.0)
MCV: 88.5 fL (ref 80.0–100.0)
Monocytes Absolute: 0.6 10*3/uL (ref 0.1–1.0)
Monocytes Relative: 8 %
Neutro Abs: 4.7 10*3/uL (ref 1.7–7.7)
Neutrophils Relative %: 70 %
Platelets: 322 10*3/uL (ref 150–400)
RBC: 4.17 MIL/uL — ABNORMAL LOW (ref 4.22–5.81)
RDW: 13.7 % (ref 11.5–15.5)
WBC: 6.7 10*3/uL (ref 4.0–10.5)
nRBC: 0 % (ref 0.0–0.2)

## 2020-05-12 LAB — COMPREHENSIVE METABOLIC PANEL
ALT: 27 U/L (ref 0–44)
AST: 33 U/L (ref 15–41)
Albumin: 3.9 g/dL (ref 3.5–5.0)
Alkaline Phosphatase: 50 U/L (ref 38–126)
Anion gap: 12 (ref 5–15)
BUN: 14 mg/dL (ref 8–23)
CO2: 26 mmol/L (ref 22–32)
Calcium: 9.4 mg/dL (ref 8.9–10.3)
Chloride: 98 mmol/L (ref 98–111)
Creatinine, Ser: 1.13 mg/dL (ref 0.61–1.24)
GFR calc Af Amer: >60 mL/min (ref >60)
GFR calc non Af Amer: >60 mL/min (ref >60)
Glucose, Bld: 129 mg/dL — ABNORMAL HIGH (ref 70–99)
Potassium: 3.4 mmol/L — ABNORMAL LOW (ref 3.5–5.1)
Sodium: 136 mmol/L (ref 135–145)
Total Bilirubin: 0.8 mg/dL (ref 0.3–1.2)
Total Protein: 6.9 g/dL (ref 6.5–8.1)

## 2020-05-12 LAB — RAPID URINE DRUG SCREEN, HOSP PERFORMED
Amphetamines: NOT DETECTED
Barbiturates: NOT DETECTED
Benzodiazepines: NOT DETECTED
Cocaine: NOT DETECTED
Opiates: POSITIVE — AB
Tetrahydrocannabinol: NOT DETECTED

## 2020-05-12 LAB — ETHANOL: Alcohol, Ethyl (B): <10 mg/dL (ref <10)

## 2020-05-12 LAB — SALICYLATE LEVEL: Salicylate Lvl: <7 mg/dL — ABNORMAL LOW (ref 7.0–30.0)

## 2020-05-12 LAB — ACETAMINOPHEN LEVEL: Acetaminophen (Tylenol), Serum: <10 ug/mL — ABNORMAL LOW (ref 10–30)

## 2020-05-12 MED ORDER — NALOXONE HCL 4 MG/0.1ML NA LIQD: 1.0000 | Freq: Once | NASAL | 1 refills | Status: AC

## 2020-05-12 MED ORDER — NALOXONE HCL 4 MG/0.1ML NA LIQD
1.0000 | Freq: Once | NASAL | Status: AC
  Administered 2020-05-12: 1 via NASAL
  Filled 2020-05-12: qty 4

## 2020-05-12 MED ORDER — LACTATED RINGERS IV BOLUS
1000.0000 mL | Freq: Once | INTRAVENOUS | Status: AC
  Administered 2020-05-12: 1000 mL via INTRAVENOUS

## 2020-05-12 NOTE — ED Triage Notes (Signed)
Pt. From home arrived [facility], pt. Friend c/o loss of consciousness, said the patient passed out. On EMS arrival, pt. A&O x2, confused and lethargic. Pt. Stated he took some of his prescribed Oxycodone. Per EMS Oxycodone was filled on 05/05/2020, pt. Was instructed to take 1 tablet every 3 days for pain and bottle was empty. Initial b/p 160/90 HR120 RR20 SATS 99 RA CBG 117  Last b/p 200/120 Pt. Currently A&O x4

## 2020-05-12 NOTE — ED Provider Notes (Signed)
[facility] EMERGENCY DEPARTMENT Provider Note   CSN: [id number] Arrival date & time: 05/12/20  0101     History Chief Complaint  Patient presents with  . Loss of Consciousness    Kurt Robinson is a 69 y.o. male.  The history is provided by the patient.  Drug Overdose This is a new problem. The current episode started less than 1 hour ago. The problem occurs constantly. The problem has been rapidly improving. Pertinent negatives include no chest pain, no abdominal pain, no headaches and no shortness of breath. Nothing aggravates the symptoms. Relieved by: narcan. Treatments tried: narcan. The treatment provided significant relief.  Patient with history of hypertension, chronic back pain, takes oxycodone 30 mg 3 times daily presents with accidental drug overdose.  EMS reports they were called by a friend.  Patient had episode of loss consciousness.  It is reported the patient took an extra dose of his oxycodone and then became somnolent.  He was given 0.4 mg of Narcan  Patient reports he feels well at this time.  Denies any fevers or vomiting.  No chest pain or shortness of breath.  Patient denies any suicide attempt, reports this is accidental     Past Medical History:  Diagnosis Date  . Chronic back pain   . Hypertension     There are no problems to display for this patient.   Past Surgical History:  Procedure Laterality Date  . SPINE SURGERY         Family History  Problem Relation Age of Onset  . Diabetes Mother   . Diabetes Other   . Stroke Other     Social History   Tobacco Use  . Smoking status: Never Smoker  Substance Use Topics  . Alcohol use: No  . Drug use: No    Home Medications Prior to Admission medications   Medication Sig Start Date End Date Taking? Authorizing Provider  acetaminophen (TYLENOL) 500 MG tablet Take 1,000 mg by mouth every 6 (six) hours as needed for moderate pain or headache (lower back pain).    [provider]  fluticasone (FLONASE) 50 MCG/ACT nasal spray Place 1 spray into both nostrils daily.    [provider]  gabapentin (NEURONTIN) 300 MG capsule Take 300 mg by mouth 3 (three) times daily.    [provider]  hydrochlorothiazide (HYDRODIURIL) 25 MG tablet Take 25 mg by mouth daily.    [provider]  oxycodone (ROXICODONE) 30 MG immediate release tablet Take 30 mg by mouth every 3 (three) hours as needed for pain.    [provider]  sodium chloride (OCEAN) 0.65 % SOLN nasal spray Place 1 spray into both nostrils as needed for congestion. Patient not taking: Reported on 04/17/2019 05/08/14 05/12/20  Horton, Mayer Masker, MD    Allergies    Celecoxib  Review of Systems   Review of Systems  Constitutional: Negative for fever.  Respiratory: Negative for shortness of breath.   Cardiovascular: Negative for chest pain.  Gastrointestinal: Negative for abdominal pain.  Neurological: Positive for syncope. Negative for headaches.  Psychiatric/Behavioral: Negative for suicidal ideas.  All other systems reviewed and are negative.   Physical Exam Updated Vital Signs BP (!) 187/114 (BP Location: Right Arm)   Pulse (!) 111   Resp 15   Ht 1.753 m (5\' 9" )   Wt 77.1 kg   SpO2 97%   BMI 25.10 kg/m   Physical Exam CONSTITUTIONAL: Well developed/well nourished HEAD: Normocephalic/atraumatic  EYES: EOMI/PERRL, pupils pinpoint ENMT: Mucous membranes moist NECK: supple no meningeal signs SPINE/BACK:entire spine nontender CV: S1/S2 noted, no murmurs/rubs/gallops noted LUNGS: Lungs are clear to auscultation bilaterally, no apparent distress ABDOMEN: soft, nontender, no rebound or guarding, bowel sounds noted throughout abdomen GU:no cva tenderness NEURO: Pt is awake/alert/appropriate, moves all extremitiesx4.  No facial droop.   EXTREMITIES: pulses normal/equal, full ROM SKIN: warm, color normal PSYCH: no abnormalities of mood noted, alert and oriented  to situation  ED Results / Procedures / Treatments   Labs (all labs ordered are listed, but only abnormal results are displayed) Labs Reviewed  COMPREHENSIVE METABOLIC PANEL - Abnormal; Notable for the following components:      Result Value   Potassium 3.4 (*)    Glucose, Bld 129 (*)    All other components within normal limits  ACETAMINOPHEN LEVEL - Abnormal; Notable for the following components:   Acetaminophen (Tylenol), Serum <10 (*)    All other components within normal limits  SALICYLATE LEVEL - Abnormal; Notable for the following components:   Salicylate Lvl <7.0 (*)    All other components within normal limits  RAPID URINE DRUG SCREEN, HOSP PERFORMED - Abnormal; Notable for the following components:   Opiates POSITIVE (*)    All other components within normal limits  CBC WITH DIFFERENTIAL/PLATELET - Abnormal; Notable for the following components:   RBC 4.17 (*)    Hemoglobin 12.1 (*)    HCT 36.9 (*)    All other components within normal limits  ETHANOL    EKG EKG Interpretation  Date/Time:  Saturday May 12 2020 01:11:04 EDT Ventricular Rate:  112 PR Interval:    QRS Duration: 84 QT Interval:  322 QTC Calculation: 440 R Axis:   55 Text Interpretation: Sinus tachycardia Interpretation limited secondary to artifact Confirmed by Zadie Rhine ([id number]) on 05/12/2020 1:17:22 AM   Radiology No results found.  Procedures Procedures   Medications Ordered in ED Medications  lactated ringers bolus 1,000 mL (0 mLs Intravenous Stopped 05/12/20 0312)  naloxone (NARCAN) nasal spray 4 mg/0.1 mL (1 spray Nasal Provided for home use 05/12/20 0346)    ED Course  I have reviewed the triage vital signs and the nursing notes.  Pertinent labs  results that were available during my care of the patient were reviewed by me and considered in my medical decision making (see chart for details).    MDM Rules/Calculators/A&P                          1:24 AM Patient presents  for accidental overdose that responded to Narcan.  Patient is currently awake and alert.  We will monitor him for the next several hours in the ER.  It appears the patient had a similar episode over a year ago and required an ER visit.  EMS reports the patient recently filled oxycodone prescription was completely empty.  I suspect patient is requiring higher levels of pain medications at night and that is what led to this overdose. 3:19 AM Patient awake alert no acute distress Had a conversation with patient about my concern with his accidental overdoses.  This is his second overdose within the past year.  Patient tells me that he has plenty of medication left in a month, but EMS reported he had an empty bottle.  Patient may be using more than prescribed.  I advised him to closely monitor this and to follow-up with his physician who prescribes his pain  medications.  I will also provide Narcan to go and a prescription for patient 5:02 AM Patient stable, awake and alert Discussed at length appropriate use of Narcan at home. Patient feels comfortable with plan. Final Clinical Impression(s) / ED Diagnoses Final diagnoses:  Accidental drug overdose, initial encounter    Rx / DC Orders ED Discharge Orders         Ordered    naloxone [facility]) nasal spray 4 mg/0.1 mL   Once        05/12/20 0317           Zadie Rhine, MD 05/12/20 0503

## 2020-06-07 IMAGING — DX PORTABLE CHEST - 1 VIEW
1 series · 1 of 1 positions shown · non-contrast
Comparison: 05/08/2014

CLINICAL DATA: Syncope

EXAM:
PORTABLE CHEST 1 VIEW

[chest]
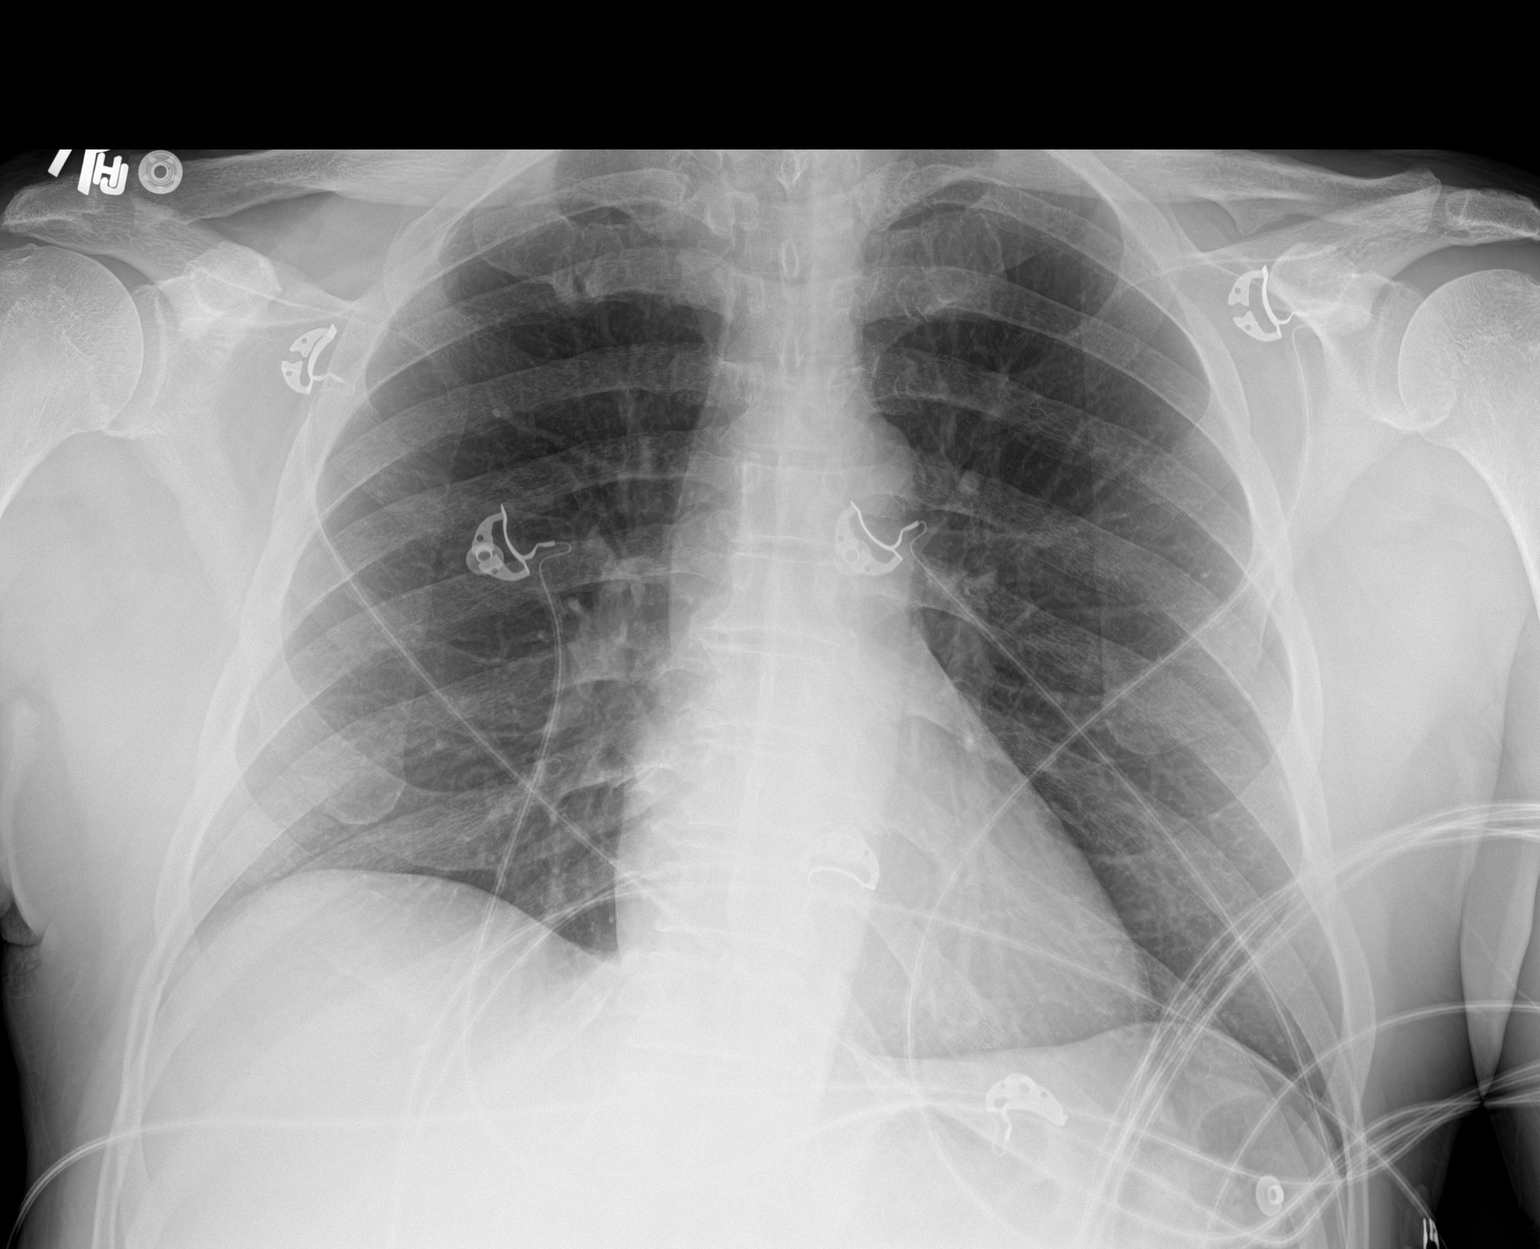

[1 of 1 positions shown; findings below may reference images not displayed]

FINDINGS: Mild right hemidiaphragm elevation. Numerous leads and wires project
over the chest. Midline trachea. Mild convex left thoracic spine
curvature. No pleural effusion or pneumothorax. Clear lungs.
IMPRESSION: No acute cardiopulmonary disease.

## 2022-06-03 ENCOUNTER — Ambulatory Visit (HOSPITAL_COMMUNITY)
Admission: EM | Admit: 2022-06-03 | Discharge: 2022-06-03 | Disposition: A | Discharge status: Home / Self Care | Payer: Medicare Other | Attending: Emergency Medicine | Admitting: Emergency Medicine

## 2022-06-03 ENCOUNTER — Encounter (HOSPITAL_COMMUNITY): Payer: Self-pay | Admitting: *Deleted

## 2022-06-03 DIAGNOSIS — B349 Viral infection, unspecified: Secondary | ICD-10-CM | POA: Diagnosis present

## 2022-06-03 DIAGNOSIS — Z20822 Contact with and (suspected) exposure to covid-19: Secondary | ICD-10-CM | POA: Insufficient documentation

## 2022-06-03 MED ORDER — MOLNUPIRAVIR EUA 200MG CAPSULE: 4.0000 | ORAL_CAPSULE | Freq: Two times a day (BID) | ORAL | 0 refills | Status: AC

## 2022-06-03 NOTE — ED Provider Notes (Signed)
[facility]    CSN: [id number] Arrival date & time: 06/03/22  1924      History   Chief Complaint Chief Complaint  Patient presents with   Covid Exposure   Headache    HPI Kurt Robinson is a 71 y.o. male.   Presents with chills and intermittent generalized headache beginning today.  Headache has resolved.  Known exposure to COVID within household.  Tolerating food and liquids.  Has not attempted treatment.  History of hypertension.  Denies fevers, congestion, ear pain, sore throat, cough, shortness of breath or wheezing.    Past Medical History:  Diagnosis Date   Chronic back pain    Hypertension     There are no problems to display for this patient.   Past Surgical History:  Procedure Laterality Date   SPINE SURGERY         Home Medications    Prior to Admission medications   Medication Sig Start Date End Date Taking? Authorizing Provider  acetaminophen (TYLENOL) 500 MG tablet Take 1,000 mg by mouth every 6 (six) hours as needed for moderate pain or headache (lower back pain).   Yes [provider]  Calcium Carb-Cholecalciferol 600-10 MG-MCG TABS TAKE 1 TABLET BY MOUTH TWICE DAILY BEFORE MORNING AND EVENING MEAL FOR BONE HEALTH WHILE ON HORMONE THERAPY 07/06/21  Yes [provider]  gabapentin (NEURONTIN) 300 MG capsule Take 300 mg by mouth 3 (three) times daily.   Yes [provider]  hydrochlorothiazide (HYDRODIURIL) 25 MG tablet Take 25 mg by mouth daily.   Yes [provider]  oxycodone (ROXICODONE) 30 MG immediate release tablet Take 30 mg by mouth every 3 (three) hours as needed for pain.   Yes [provider]  pantoprazole (PROTONIX) 40 MG tablet Take 1 tablet by mouth every morning. 08/30/21  Yes [provider]  fluticasone (FLONASE) 50 MCG/ACT nasal spray Place 1 spray into both nostrils daily.    [provider]  sodium chloride (OCEAN) 0.65 % SOLN nasal spray Place 1 spray into  both nostrils as needed for congestion. Patient not taking: Reported on 04/17/2019 05/08/14 05/12/20  Horton, Mayer Masker, MD    Family History Family History  Problem Relation Age of Onset   Diabetes Mother    Diabetes Other    Stroke Other     Social History Social History   Tobacco Use   Smoking status: Never  Substance Use Topics   Alcohol use: No   Drug use: No     Allergies   Celecoxib   Review of Systems Review of Systems  Constitutional:  Positive for chills. Negative for activity change, appetite change, diaphoresis, fatigue, fever and unexpected weight change.  HENT: Negative.    Respiratory: Negative.    Cardiovascular: Negative.   Gastrointestinal: Negative.   Skin: Negative.   Neurological:  Positive for headaches. Negative for dizziness, tremors, seizures, syncope, facial asymmetry, speech difficulty, weakness, light-headedness and numbness.     Physical Exam Triage Vital Signs ED Triage Vitals  Enc Vitals Group     BP 06/03/22 2004 138/88     Pulse Rate 06/03/22 2004 90     Resp 06/03/22 2004 18     Temp 06/03/22 2004 98.2 F (36.8 C)     Temp Source 06/03/22 2004 Oral     SpO2 06/03/22 2004 98 %     Weight --      Height --      Head Circumference --  Peak Flow --      Pain Score 06/03/22 2001 0     Pain Loc --      Pain Edu? --      Excl. in [place]? --    No data found.  Updated Vital Signs BP 138/88 (BP Location: Right Arm)   Pulse 90   Temp 98.2 F (36.8 C) (Oral)   Resp 18   SpO2 98%   Visual Acuity Right Eye Distance:   Left Eye Distance:   Bilateral Distance:    Right Eye Near:   Left Eye Near:    Bilateral Near:     Physical Exam Vitals and nursing note reviewed.  Constitutional:      General: He is not in acute distress.    Appearance: He is well-developed.  HENT:     Head: Normocephalic and atraumatic.  Eyes:     Conjunctiva/sclera: Conjunctivae normal.  Cardiovascular:     Rate and Rhythm: Normal rate and  regular rhythm.     Pulses: Normal pulses.     Heart sounds: Normal heart sounds. No murmur heard. Pulmonary:     Effort: Pulmonary effort is normal. No respiratory distress.     Breath sounds: Normal breath sounds.  Abdominal:     Palpations: Abdomen is soft.     Tenderness: There is no abdominal tenderness.  Musculoskeletal:        General: No swelling.     Cervical back: Neck supple.  Skin:    General: Skin is warm and dry.     Capillary Refill: Capillary refill takes less than 2 seconds.  Neurological:     Mental Status: He is alert.  Psychiatric:        Mood and Affect: Mood normal.      UC Treatments / Results  Labs (all labs ordered are listed, but only abnormal results are displayed) Labs Reviewed  SARS CORONAVIRUS 2 (TAT 6-24 HRS)    EKG   Radiology No results found.  Procedures Procedures (including critical care time)  Medications Ordered in UC Medications - No data to display  Initial Impression / Assessment and Plan / UC Course  I have reviewed the triage vital signs and the nursing notes.  Pertinent labs & imaging results that were available during my care of the patient were reviewed by me and considered in my medical decision making (see chart for details).  Close exposure to COVID, viral illness  Patient is in no signs of distress nor toxic appearing.  Vital signs are stable.  Low suspicion for pneumonia, pneumothorax or bronchitis and therefore will defer imaging.  COVID test is pending, reviewed quarantine guidelines per CDC recommendations, as it is very reasonable as contacts of COVID are in household prior to illness beginning, antivirals have already been prescribed and discussed administration   May use additional over-the-counter medications as needed for supportive care.  May follow-up with urgent care as needed if symptoms persist or worsen.  Final Clinical Impressions(s) / UC Diagnoses   Final diagnoses:  None   Discharge  Instructions   None    ED Prescriptions   None    PDMP not reviewed this encounter.   Hans Eden, NP 06/03/22 2030

## 2022-06-03 NOTE — Discharge Instructions (Signed)
We will contact you if your COVID test is positive.  Please quarantine while you wait for the results.  If your test is negative you may resume normal activities.  If your test is positive please continue to quarantine for at least 5 days from your symptom onset , if symptoms are still present after 5 days please wear masks while completing activities.    You are most likely positive for COVID as the contacts are in your house, begin use of antiviral every morning and every evening for 5 days, this medicine reduces the amount of virus in your body which will minimize your symptoms but does not fully take it away, at this time I am unsure of the cost of this medication, if you are unable to afford the medicine that is okay you do not need the medicine as the virus will process to your body like a normal cold You can take Tylenol and/or Ibuprofen as needed for fever reduction and pain relief.   For cough: honey 1/2 to 1 teaspoon (you can dilute the honey in water or another fluid).  You can also use guaifenesin and dextromethorphan for cough. You can use a humidifier for chest congestion and cough.  If you don't have a humidifier, you can sit in the bathroom with the hot shower running.      For sore throat: try warm salt water gargles, cepacol lozenges, throat spray, warm tea or water with lemon/honey, popsicles or ice, or OTC cold relief medicine for throat discomfort.   For congestion: take a daily anti-histamine like Zyrtec, Claritin, and a oral decongestant, such as pseudoephedrine.  You can also use Flonase 1-2 sprays in each nostril daily.   It is important to stay hydrated: drink plenty of fluids (water, gatorade/powerade/pedialyte, juices, or teas) to keep your throat moisturized and help further relieve irritation/discomfort.

## 2022-06-03 NOTE — ED Triage Notes (Signed)
Pt states that he has two people in his house that are covid positive and he would like tested. He has a headache which was better with tylenol and chills which started this morning.

## 2022-06-04 LAB — SARS CORONAVIRUS 2 (TAT 6-24 HRS): SARS Coronavirus 2: NEGATIVE
